# Patient Record
Sex: Male | Born: 1935 | Race: White | Hispanic: No | Marital: Single | Smoking: Former smoker
Health system: Southern US, Community
[De-identification: ages and names within clinical notes are randomized; demographics above are authoritative.]

## PROBLEM LIST (undated history)

## (undated) DIAGNOSIS — I10 Essential (primary) hypertension: Secondary | ICD-10-CM

## (undated) HISTORY — PX: BACK SURGERY: SHX140

## (undated) HISTORY — PX: PENILE PROSTHESIS IMPLANT: SHX240

---

## 2014-04-20 ENCOUNTER — Emergency Department (HOSPITAL_COMMUNITY): Payer: Medicare Other

## 2014-04-20 ENCOUNTER — Observation Stay (HOSPITAL_COMMUNITY)
Admission: EM | Admit: 2014-04-20 | Discharge: 2014-04-24 | Disposition: A | Discharge status: Home / Self Care | Payer: Medicare Other | Attending: Surgery | Admitting: Surgery

## 2014-04-20 ENCOUNTER — Encounter (HOSPITAL_COMMUNITY): Payer: Self-pay | Admitting: Emergency Medicine

## 2014-04-20 DIAGNOSIS — W19XXXA Unspecified fall, initial encounter: Secondary | ICD-10-CM

## 2014-04-20 DIAGNOSIS — S22009A Unspecified fracture of unspecified thoracic vertebra, initial encounter for closed fracture: Secondary | ICD-10-CM | POA: Diagnosis not present

## 2014-04-20 DIAGNOSIS — Y9389 Activity, other specified: Secondary | ICD-10-CM | POA: Diagnosis not present

## 2014-04-20 DIAGNOSIS — Z87891 Personal history of nicotine dependence: Secondary | ICD-10-CM | POA: Insufficient documentation

## 2014-04-20 DIAGNOSIS — Y998 Other external cause status: Secondary | ICD-10-CM | POA: Insufficient documentation

## 2014-04-20 DIAGNOSIS — R109 Unspecified abdominal pain: Secondary | ICD-10-CM | POA: Diagnosis not present

## 2014-04-20 DIAGNOSIS — S2232XA Fracture of one rib, left side, initial encounter for closed fracture: Secondary | ICD-10-CM

## 2014-04-20 DIAGNOSIS — IMO0002 Reserved for concepts with insufficient information to code with codable children: Secondary | ICD-10-CM | POA: Diagnosis present

## 2014-04-20 DIAGNOSIS — I10 Essential (primary) hypertension: Secondary | ICD-10-CM | POA: Diagnosis not present

## 2014-04-20 DIAGNOSIS — S270XXA Traumatic pneumothorax, initial encounter: Secondary | ICD-10-CM | POA: Insufficient documentation

## 2014-04-20 DIAGNOSIS — M25559 Pain in unspecified hip: Secondary | ICD-10-CM | POA: Diagnosis not present

## 2014-04-20 DIAGNOSIS — W1789XA Other fall from one level to another, initial encounter: Secondary | ICD-10-CM | POA: Insufficient documentation

## 2014-04-20 DIAGNOSIS — S2249XA Multiple fractures of ribs, unspecified side, initial encounter for closed fracture: Secondary | ICD-10-CM | POA: Diagnosis present

## 2014-04-20 DIAGNOSIS — S32009A Unspecified fracture of unspecified lumbar vertebra, initial encounter for closed fracture: Secondary | ICD-10-CM | POA: Diagnosis not present

## 2014-04-20 DIAGNOSIS — S2239XA Fracture of one rib, unspecified side, initial encounter for closed fracture: Secondary | ICD-10-CM | POA: Diagnosis present

## 2014-04-20 HISTORY — DX: Essential (primary) hypertension: I10

## 2014-04-20 LAB — I-STAT CHEM 8, ED
BUN: 19 mg/dL (ref 6–23)
CREATININE: 1.3 mg/dL (ref 0.50–1.35)
Calcium, Ion: 1.21 mmol/L (ref 1.13–1.30)
Chloride: 104 mEq/L (ref 96–112)
Glucose, Bld: 132 mg/dL — ABNORMAL HIGH (ref 70–99)
HCT: 42 % (ref 39.0–52.0)
HEMOGLOBIN: 14.3 g/dL (ref 13.0–17.0)
Potassium: 4.3 mEq/L (ref 3.7–5.3)
SODIUM: 142 meq/L (ref 137–147)
TCO2: 25 mmol/L (ref 0–100)

## 2014-04-20 MED ORDER — IOHEXOL 300 MG/ML  SOLN
80.0000 mL | Freq: Once | INTRAMUSCULAR | Status: AC | PRN
  Administered 2014-04-20: 80 mL via INTRAVENOUS

## 2014-04-20 MED ORDER — MORPHINE SULFATE 4 MG/ML IJ SOLN
4.0000 mg | Freq: Once | INTRAMUSCULAR | Status: AC
  Administered 2014-04-20: 4 mg via INTRAVENOUS
  Filled 2014-04-20: qty 1

## 2014-04-20 NOTE — ED Notes (Signed)
Pt was building a tree stand today and was 8 feet when he fell.  Pt landed on grass ground with left side of body.  Pt is moving all extremities.  No neck or back pain.  Pt is on LSB.  Pt has complained of left lumbar/flank area pain.  No LOC.  BP150/90  P-108.

## 2014-04-20 NOTE — ED Notes (Signed)
Pt returned from CT  Pt requesting something for pain at this time

## 2014-04-20 NOTE — ED Notes (Signed)
Pt to CT at this time.

## 2014-04-20 NOTE — H&P (Signed)
History   Blake [name] is an 78 y.o. male.   Chief Complaint:  Chief Complaint  Patient presents with  . Fall    Fall Associated symptoms include chest pain. Pertinent negatives include no abdominal pain, coughing, headaches, myalgias, nausea, neck pain or vomiting.  This is a 78 yo male that lives in [place], [place], who was in the area working on his deer stand, preparing it for deer season this fall.  He lost his balance and fell backwards about 8 feet, landing on the left side of his back.  He did not hit his head.  No LOC.  He is complaining of pain on the left side of his back from pelvis to shoulder.  No shortness of breath.  No anterior chest or abdominal pain.  No nausea or vomiting.   Past Medical History  Diagnosis Date  . Hypertension     Past Surgical History  Procedure Laterality Date  . Back surgery    . Penile prosthesis implant    Lumbar fusion  No family history on file. Social History:  reports that he has quit smoking. He does not have any smokeless tobacco history on file. He reports that he drinks alcohol. He reports that he does not use illicit drugs.  Allergies   Allergies  Allergen Reactions  . Penicillins Shortness Of Breath, Swelling and Rash  . Septra [Sulfamethoxazole-Trimethoprim] Other (See Comments)    Told not to take due to liver    Home Medications   Prior to Admission medications   Medication Sig Start Date End Date Taking? Authorizing Provider  losartan (COZAAR) 100 MG tablet Take 100 mg by mouth daily.   Yes Historical Provider, MD     Trauma Course   Results for orders placed during the hospital encounter of 04/20/14 (from the past 48 hour(s))  I-STAT CHEM 8, ED     Status: Abnormal   Collection Time    04/20/14  9:28 PM      Result Value Ref Range   Sodium 142  137 - 147 mEq/L   Potassium 4.3  3.7 - 5.3 mEq/L   Chloride 104  96 - 112 mEq/L   BUN 19  6 - 23 mg/dL   Creatinine, Ser 0.981.30  0.50 - 1.35 mg/dL   Glucose, Bld 119132  (*) 70 - 99 mg/dL   Calcium, Ion 1.471.21  8.291.13 - 1.30 mmol/L   TCO2 25  0 - 100 mmol/L   Hemoglobin 14.3  13.0 - 17.0 g/dL   HCT 56.242.0  13.039.0 - 86.552.0 %   Ct Chest W Contrast  04/20/2014   CLINICAL DATA:  Blake [name] 8 feet from a deer stand.  Left sided pain.  EXAM: CT CHEST, ABDOMEN, AND PELVIS WITH CONTRAST  TECHNIQUE: Multidetector CT imaging of the chest, abdomen and pelvis was performed following the standard protocol during bolus administration of intravenous contrast.  CONTRAST:  80mL OMNIPAQUE IOHEXOL 300 MG/ML  SOLN  COMPARISON:  None.  FINDINGS: CT CHEST FINDINGS  Soft tissue / Mediastinum: There is no axillary lymphadenopathy. No mediastinal or hilar lymphadenopathy. Heart size is normal. Coronary artery calcification is noted. No pericardial effusion. Soft tissue gas is seen along the posterior left chest wall, adjacent to the ribs.  2.0 cm round lesion in the skin and subcutaneous tissues of the midline back is probably a sebaceous cyst.  Lungs / Pleura: Fine detail of the lung parenchyma is obscured by respiratory motion. Are there is no focal airspace consolidation. Compressive atelectasis  is seen bilaterally. There are a few tiny gas loculations in the left posterior pleural space.  Bones: Fractures of the posterior left eighth through eleventh ribs are evident with minimal displacement at the level of the eighth rib. Heart the patient also was nondisplaced fractures of the left T8, 9, 10, 11, and 12 transverse processes.  CT ABDOMEN AND PELVIS FINDINGS  Liver:  Insert normal Liver  Spleen: No splenomegaly. No focal mass lesion.  Stomach: Nondistended. No gastric wall thickening. No evidence of outlet obstruction.  Pancreas: No focal mass lesion. No dilatation of the main duct. No intraparenchymal cyst. No peripancreatic edema.  Gallbladder/Biliary: No evidence for gallstones. No pericholecystic fluid. No intrahepatic or extrahepatic biliary dilation.  Kidneys/Adrenals: No adrenal nodule or mass. Small 6  mm low-density lesion in the left kidney is likely a cyst.  Bowel Loops: No evidence for small bowel obstruction. No small bowel wall thickening. Terminal ileum is normal. The appendix is normal. No colonic diverticulitis.  Nodes: No abdominal lymphadenopathy. No pelvic sidewall lymphadenopathy.  Vasculature: Atherosclerotic calcification is noted in the wall of the abdominal aorta without aneurysm.  Pelvic Genitourinary: Bladder is moderately distended. The prostate gland is enlarged. Penile prosthesis is evident.  Bones/Musculoskeletal: Patient is status post lower lumbar fusion. Left-sided transverse process fractures are seen at L1, L2, L3, L4, and L5.  Body Wall: No evidence for abdominal wall hernia.  Other: No intraperitoneal free fluid.  IMPRESSION: 1. Fractures of the posterior left eighth through eleventh ribs. There is soft tissue gas adjacent to the rib fractures in a trace amount of gas is seen deep to the ribs consistent with a trace pneumothorax although no gross pneumothorax is evident. No confluent gas is seen within the left pleural space. 2. The patient has numerous left-sided transverse process fractures extending from the T8 level down to the L5 level. 3. No evidence for solid organ injury in the abdomen. There is no perihepatic or perisplenic free fluid. No free fluid in the pelvis.   Electronically Signed   By: Kennith Center M.D.   On: 04/20/2014 23:09   Ct Abdomen Pelvis W Contrast  04/20/2014   CLINICAL DATA:  Blake [name] 8 feet from a deer stand.  Left sided pain.  EXAM: CT CHEST, ABDOMEN, AND PELVIS WITH CONTRAST  TECHNIQUE: Multidetector CT imaging of the chest, abdomen and pelvis was performed following the standard protocol during bolus administration of intravenous contrast.  CONTRAST:  80mL OMNIPAQUE IOHEXOL 300 MG/ML  SOLN  COMPARISON:  None.  FINDINGS: CT CHEST FINDINGS  Soft tissue / Mediastinum: There is no axillary lymphadenopathy. No mediastinal or hilar lymphadenopathy. Heart size  is normal. Coronary artery calcification is noted. No pericardial effusion. Soft tissue gas is seen along the posterior left chest wall, adjacent to the ribs.  2.0 cm round lesion in the skin and subcutaneous tissues of the midline back is probably a sebaceous cyst.  Lungs / Pleura: Fine detail of the lung parenchyma is obscured by respiratory motion. Are there is no focal airspace consolidation. Compressive atelectasis is seen bilaterally. There are a few tiny gas loculations in the left posterior pleural space.  Bones: Fractures of the posterior left eighth through eleventh ribs are evident with minimal displacement at the level of the eighth rib. Heart the patient also was nondisplaced fractures of the left T8, 9, 10, 11, and 12 transverse processes.  CT ABDOMEN AND PELVIS FINDINGS  Liver:  Insert normal Liver  Spleen: No splenomegaly. No focal mass lesion.  Stomach: Nondistended. No gastric wall thickening. No evidence of outlet obstruction.  Pancreas: No focal mass lesion. No dilatation of the main duct. No intraparenchymal cyst. No peripancreatic edema.  Gallbladder/Biliary: No evidence for gallstones. No pericholecystic fluid. No intrahepatic or extrahepatic biliary dilation.  Kidneys/Adrenals: No adrenal nodule or mass. Small 6 mm low-density lesion in the left kidney is likely a cyst.  Bowel Loops: No evidence for small bowel obstruction. No small bowel wall thickening. Terminal ileum is normal. The appendix is normal. No colonic diverticulitis.  Nodes: No abdominal lymphadenopathy. No pelvic sidewall lymphadenopathy.  Vasculature: Atherosclerotic calcification is noted in the wall of the abdominal aorta without aneurysm.  Pelvic Genitourinary: Bladder is moderately distended. The prostate gland is enlarged. Penile prosthesis is evident.  Bones/Musculoskeletal: Patient is status post lower lumbar fusion. Left-sided transverse process fractures are seen at L1, L2, L3, L4, and L5.  Body Wall: No evidence for  abdominal wall hernia.  Other: No intraperitoneal free fluid.  IMPRESSION: 1. Fractures of the posterior left eighth through eleventh ribs. There is soft tissue gas adjacent to the rib fractures in a trace amount of gas is seen deep to the ribs consistent with a trace pneumothorax although no gross pneumothorax is evident. No confluent gas is seen within the left pleural space. 2. The patient has numerous left-sided transverse process fractures extending from the T8 level down to the L5 level. 3. No evidence for solid organ injury in the abdomen. There is no perihepatic or perisplenic free fluid. No free fluid in the pelvis.   Electronically Signed   By: Kennith Center M.D.   On: 04/20/2014 23:09   Dg Pelvis Portable  04/20/2014   CLINICAL DATA:  Fall from deer stand.  Pain.  EXAM: PORTABLE PELVIS 1-2 VIEWS  COMPARISON:  None.  FINDINGS: Supine view of the pelvis shows no evidence for fracture. SI joints and symphysis pubis are unremarkable. Joint space in the hips is well preserved and symmetric. Penile prosthesis noted. The patient is status post lower lumbar fusion.  IMPRESSION: No evidence for acute pelvic fracture.   Electronically Signed   By: Kennith Center M.D.   On: 04/20/2014 19:18   Dg Chest Portable 1 View  04/20/2014   CLINICAL DATA:  Blake [name] 8 feet.  EXAM: PORTABLE CHEST - 1 VIEW  COMPARISON:  None.  FINDINGS: 1847 hrs. Low volume film without evidence for pneumothorax, focal airspace consolidation or pleural effusion. The cardio pericardial silhouette is enlarged. Haziness over the right apex is attributed to rotation superimposed soft tissue. Telemetry leads overlie the chest.  IMPRESSION: No acute cardiopulmonary findings.   Electronically Signed   By: Kennith Center M.D.   On: 04/20/2014 19:17    Review of Systems  Constitutional: Negative for weight loss.  HENT: Negative for ear discharge, ear pain, hearing loss and tinnitus.   Eyes: Negative for blurred vision, double vision, photophobia  and pain.  Respiratory: Negative for cough, sputum production and shortness of breath.   Cardiovascular: Positive for chest pain.  Gastrointestinal: Negative for nausea, vomiting and abdominal pain.  Genitourinary: Negative for dysuria, urgency, frequency and flank pain.  Musculoskeletal: Positive for back pain, falls and joint pain. Negative for myalgias and neck pain.  Neurological: Negative for dizziness, tingling, sensory change, focal weakness, loss of consciousness and headaches.  Endo/Heme/Allergies: Does not bruise/bleed easily.  Psychiatric/Behavioral: Negative for depression, memory loss and substance abuse. The patient is not nervous/anxious.     Blood pressure 136/73, pulse 115, temperature 97.8  F (36.6 C), temperature source Oral, resp. rate 21, SpO2 99.00%. Physical Exam  Constitutional: He is oriented to person, place, and time. He appears well-developed and well-nourished.  HENT:  Head: Normocephalic and atraumatic.  Eyes: EOM are normal. Pupils are equal, round, and reactive to light.  Neck: Normal range of motion. Neck supple.  Cardiovascular: Regular rhythm.   Mildly tachycardic  Respiratory: Effort normal and breath sounds normal.  CTA bilaterally Tender over posterior left chest and spine  GI: Soft. Bowel sounds are normal.  Musculoskeletal: Normal range of motion.  Neurological: He is alert and oriented to person, place, and time.  Skin: Skin is warm and dry.  Psychiatric: He has a normal mood and affect. His behavior is normal. Judgment and thought content normal.    Assessment/Plan 1.  Fall from 8 feet 2.  Posterior rib fractures left ribs 8-11 3.  Left transverse process fractures T8-12, L1-5 4.  Tiny occult pneumothorax left  Admit for observation, pain control Incentive spirometer Oxygen Ambulate with assistance  Ophelia Sipe K. 04/20/2014, 11:57 PM   Procedures

## 2014-04-20 NOTE — ED Provider Notes (Signed)
CSN: [id number]     Arrival date & time 04/20/14  1802 History   First MD Initiated Contact with Patient 04/20/14 1806     Chief Complaint  Patient presents with  . Fall     (Consider location/radiation/quality/duration/timing/severity/associated sxs/prior Treatment) Patient is a 78 y.o. male presenting with trauma.  Trauma Mechanism of injury: fall Injury location: torso Injury location detail: L chest and L flank Incident location: home Arrived directly from scene: yes   Fall:      Fall occurred: from a ladder      Height of fall: 7 ft      Impact surface: dirt and grass      Point of impact: back      Entrapped after fall: no  EMS/PTA data:      Bystander interventions: none      Ambulatory at scene: no      Loss of consciousness: no      Amnesic to event: no  Current symptoms:      Pain scale: 2/10      Pain timing: intermittent      Associated symptoms:            Reports back pain.            Denies abdominal pain, chest pain, headache, loss of consciousness, nausea, neck pain, seizures and vomiting.    Past Medical History  Diagnosis Date  . Hypertension    Past Surgical History  Procedure Laterality Date  . Back surgery    . Penile prosthesis implant     No family history on file. History  Substance Use Topics  . Smoking status: Former Games developermoker  . Smokeless tobacco: Not on file  . Alcohol Use: Yes     Comment: occasional    Review of Systems  Constitutional: Negative for fever and chills.  HENT: Negative for sore throat.   Eyes: Negative for pain.  Respiratory: Negative for cough and shortness of breath.   Cardiovascular: Negative for chest pain.  Gastrointestinal: Negative for nausea, vomiting and abdominal pain.  Genitourinary: Negative for dysuria and flank pain.  Musculoskeletal: Positive for back pain. Negative for neck pain.  Skin: Negative for rash.  Neurological: Negative for seizures, loss of consciousness and headaches.       Allergies  Penicillins and Septra  Home Medications   Prior to Admission medications   Medication Sig Start Date End Date Taking? Authorizing Provider  losartan (COZAAR) 100 MG tablet Take 100 mg by mouth daily.   Yes Historical Provider, MD   BP 136/73  Pulse 115  Temp(Src) 97.8 F (36.6 C) (Oral)  Resp 21  SpO2 99% Physical Exam  Constitutional: He is oriented to person, place, and time. He appears well-developed and well-nourished. No distress.  HENT:  Head: Normocephalic and atraumatic.  Eyes: Pupils are equal, round, and reactive to light.  Neck: Normal range of motion.  Cardiovascular: Normal rate and regular rhythm.   Pulmonary/Chest: Effort normal and breath sounds normal. No respiratory distress. He exhibits no bony tenderness.  Abdominal: Soft. He exhibits no distension. There is no tenderness.  Musculoskeletal: Normal range of motion.       Cervical back: He exhibits no bony tenderness and no deformity.       Thoracic back: He exhibits no bony tenderness and no deformity.       Lumbar back: He exhibits tenderness and bony tenderness. He exhibits no deformity.  Neurological: He is alert and oriented to person, place,  and time.  Skin: Skin is warm. He is not diaphoretic.    ED Course  Procedures (including critical care time) Labs Review Labs Reviewed  I-STAT CHEM 8, ED - Abnormal; Notable for the following:    Glucose, Bld 132 (*)    All other components within normal limits  CDS SEROLOGY  COMPREHENSIVE METABOLIC PANEL  CBC  ETHANOL  PROTIME-INR  SAMPLE TO BLOOD BANK    Imaging Review Ct Chest W Contrast  04/20/2014   CLINICAL DATA:  [name] 8 feet from a deer stand.  Left sided pain.  EXAM: CT CHEST, ABDOMEN, AND PELVIS WITH CONTRAST  TECHNIQUE: Multidetector CT imaging of the chest, abdomen and pelvis was performed following the standard protocol during bolus administration of intravenous contrast.  CONTRAST:  80mL OMNIPAQUE IOHEXOL 300 MG/ML  SOLN   COMPARISON:  None.  FINDINGS: CT CHEST FINDINGS  Soft tissue / Mediastinum: There is no axillary lymphadenopathy. No mediastinal or hilar lymphadenopathy. Heart size is normal. Coronary artery calcification is noted. No pericardial effusion. Soft tissue gas is seen along the posterior left chest wall, adjacent to the ribs.  2.0 cm round lesion in the skin and subcutaneous tissues of the midline back is probably a sebaceous cyst.  Lungs / Pleura: Fine detail of the lung parenchyma is obscured by respiratory motion. Are there is no focal airspace consolidation. Compressive atelectasis is seen bilaterally. There are a few tiny gas loculations in the left posterior pleural space.  Bones: Fractures of the posterior left eighth through eleventh ribs are evident with minimal displacement at the level of the eighth rib. Heart the patient also was nondisplaced fractures of the left T8, 9, 10, 11, and 12 transverse processes.  CT ABDOMEN AND PELVIS FINDINGS  Liver:  Insert normal Liver  Spleen: No splenomegaly. No focal mass lesion.  Stomach: Nondistended. No gastric wall thickening. No evidence of outlet obstruction.  Pancreas: No focal mass lesion. No dilatation of the main duct. No intraparenchymal cyst. No peripancreatic edema.  Gallbladder/Biliary: No evidence for gallstones. No pericholecystic fluid. No intrahepatic or extrahepatic biliary dilation.  Kidneys/Adrenals: No adrenal nodule or mass. Small 6 mm low-density lesion in the left kidney is likely a cyst.  Bowel Loops: No evidence for small bowel obstruction. No small bowel wall thickening. Terminal ileum is normal. The appendix is normal. No colonic diverticulitis.  Nodes: No abdominal lymphadenopathy. No pelvic sidewall lymphadenopathy.  Vasculature: Atherosclerotic calcification is noted in the wall of the abdominal aorta without aneurysm.  Pelvic Genitourinary: Bladder is moderately distended. The prostate gland is enlarged. Penile prosthesis is evident.   Bones/Musculoskeletal: Patient is status post lower lumbar fusion. Left-sided transverse process fractures are seen at L1, L2, L3, L4, and L5.  Body Wall: No evidence for abdominal wall hernia.  Other: No intraperitoneal free fluid.  IMPRESSION: 1. Fractures of the posterior left eighth through eleventh ribs. There is soft tissue gas adjacent to the rib fractures in a trace amount of gas is seen deep to the ribs consistent with a trace pneumothorax although no gross pneumothorax is evident. No confluent gas is seen within the left pleural space. 2. The patient has numerous left-sided transverse process fractures extending from the T8 level down to the L5 level. 3. No evidence for solid organ injury in the abdomen. There is no perihepatic or perisplenic free fluid. No free fluid in the pelvis.   Electronically Signed   By: Kennith Center M.D.   On: 04/20/2014 23:09   Ct Abdomen Pelvis  W Contrast  04/20/2014   CLINICAL DATA:  [name] 8 feet from a deer stand.  Left sided pain.  EXAM: CT CHEST, ABDOMEN, AND PELVIS WITH CONTRAST  TECHNIQUE: Multidetector CT imaging of the chest, abdomen and pelvis was performed following the standard protocol during bolus administration of intravenous contrast.  CONTRAST:  80mL OMNIPAQUE IOHEXOL 300 MG/ML  SOLN  COMPARISON:  None.  FINDINGS: CT CHEST FINDINGS  Soft tissue / Mediastinum: There is no axillary lymphadenopathy. No mediastinal or hilar lymphadenopathy. Heart size is normal. Coronary artery calcification is noted. No pericardial effusion. Soft tissue gas is seen along the posterior left chest wall, adjacent to the ribs.  2.0 cm round lesion in the skin and subcutaneous tissues of the midline back is probably a sebaceous cyst.  Lungs / Pleura: Fine detail of the lung parenchyma is obscured by respiratory motion. Are there is no focal airspace consolidation. Compressive atelectasis is seen bilaterally. There are a few tiny gas loculations in the left posterior pleural space.   Bones: Fractures of the posterior left eighth through eleventh ribs are evident with minimal displacement at the level of the eighth rib. Heart the patient also was nondisplaced fractures of the left T8, 9, 10, 11, and 12 transverse processes.  CT ABDOMEN AND PELVIS FINDINGS  Liver:  Insert normal Liver  Spleen: No splenomegaly. No focal mass lesion.  Stomach: Nondistended. No gastric wall thickening. No evidence of outlet obstruction.  Pancreas: No focal mass lesion. No dilatation of the main duct. No intraparenchymal cyst. No peripancreatic edema.  Gallbladder/Biliary: No evidence for gallstones. No pericholecystic fluid. No intrahepatic or extrahepatic biliary dilation.  Kidneys/Adrenals: No adrenal nodule or mass. Small 6 mm low-density lesion in the left kidney is likely a cyst.  Bowel Loops: No evidence for small bowel obstruction. No small bowel wall thickening. Terminal ileum is normal. The appendix is normal. No colonic diverticulitis.  Nodes: No abdominal lymphadenopathy. No pelvic sidewall lymphadenopathy.  Vasculature: Atherosclerotic calcification is noted in the wall of the abdominal aorta without aneurysm.  Pelvic Genitourinary: Bladder is moderately distended. The prostate gland is enlarged. Penile prosthesis is evident.  Bones/Musculoskeletal: Patient is status post lower lumbar fusion. Left-sided transverse process fractures are seen at L1, L2, L3, L4, and L5.  Body Wall: No evidence for abdominal wall hernia.  Other: No intraperitoneal free fluid.  IMPRESSION: 1. Fractures of the posterior left eighth through eleventh ribs. There is soft tissue gas adjacent to the rib fractures in a trace amount of gas is seen deep to the ribs consistent with a trace pneumothorax although no gross pneumothorax is evident. No confluent gas is seen within the left pleural space. 2. The patient has numerous left-sided transverse process fractures extending from the T8 level down to the L5 level. 3. No evidence for  solid organ injury in the abdomen. There is no perihepatic or perisplenic free fluid. No free fluid in the pelvis.   Electronically Signed   By: Kennith Center M.D.   On: 04/20/2014 23:09   Dg Pelvis Portable  04/20/2014   CLINICAL DATA:  Fall from deer stand.  Pain.  EXAM: PORTABLE PELVIS 1-2 VIEWS  COMPARISON:  None.  FINDINGS: Supine view of the pelvis shows no evidence for fracture. SI joints and symphysis pubis are unremarkable. Joint space in the hips is well preserved and symmetric. Penile prosthesis noted. The patient is status post lower lumbar fusion.  IMPRESSION: No evidence for acute pelvic fracture.   Electronically Signed   By:  Kennith Center M.D.   On: 04/20/2014 19:18   Dg Chest Portable 1 View  04/20/2014   CLINICAL DATA:  [name] 8 feet.  EXAM: PORTABLE CHEST - 1 VIEW  COMPARISON:  None.  FINDINGS: 1847 hrs. Low volume film without evidence for pneumothorax, focal airspace consolidation or pleural effusion. The cardio pericardial silhouette is enlarged. Haziness over the right apex is attributed to rotation superimposed soft tissue. Telemetry leads overlie the chest.  IMPRESSION: No acute cardiopulmonary findings.   Electronically Signed   By: Kennith Center M.D.   On: 04/20/2014 19:17     EKG Interpretation None      MDM   Final diagnoses:  Rib fractures, left, closed, initial encounter  Multiple transverse process fractures   78 year old male with a history of hypertension presents after falling from a ladder approximately 78 feet landing on his left side.  On arrival the patient is hemodynamically stable and appears in no acute distress. He is complaining of pain in his left side. He appears uncomfortable. Given the patient's age and given the fall the plan is to obtain x-rays of his chest and pelvis and then sent him for CT scan of his chest abdomen pelvis.  Plain films of injury no acute findings. No evidence of acute pelvic fracture. No acute cardiopulmonary findings. CT  scan demonstrates fractures of the posterior left eighth through 11th ribs as well as transverse process fractures from T8-L5. Consulted with trauma for admission for pain control. Anticipate admission in stable condition. Patient seen and evaluated by myself and by the attending Dr. Judd Lien.      Imagene Sheller, MD 04/20/14 [phone]

## 2014-04-21 ENCOUNTER — Observation Stay (HOSPITAL_COMMUNITY): Payer: Medicare Other

## 2014-04-21 DIAGNOSIS — S2249XA Multiple fractures of ribs, unspecified side, initial encounter for closed fracture: Secondary | ICD-10-CM

## 2014-04-21 DIAGNOSIS — S22009A Unspecified fracture of unspecified thoracic vertebra, initial encounter for closed fracture: Secondary | ICD-10-CM

## 2014-04-21 DIAGNOSIS — S8290XS Unspecified fracture of unspecified lower leg, sequela: Secondary | ICD-10-CM

## 2014-04-21 LAB — COMPREHENSIVE METABOLIC PANEL
ALT: 32 U/L (ref 0–53)
AST: 47 U/L — ABNORMAL HIGH (ref 0–37)
Albumin: 3.5 g/dL (ref 3.5–5.2)
Alkaline Phosphatase: 66 U/L (ref 39–117)
Anion gap: 12 (ref 5–15)
BUN: 19 mg/dL (ref 6–23)
CO2: 25 mEq/L (ref 19–32)
Calcium: 9.2 mg/dL (ref 8.4–10.5)
Chloride: 105 mEq/L (ref 96–112)
Creatinine, Ser: 1.13 mg/dL (ref 0.50–1.35)
GFR calc non Af Amer: 61 mL/min — ABNORMAL LOW (ref >90)
GFR, EST AFRICAN AMERICAN: 71 mL/min — AB (ref >90)
GLUCOSE: 145 mg/dL — AB (ref 70–99)
Potassium: 4.8 mEq/L (ref 3.7–5.3)
Sodium: 142 mEq/L (ref 137–147)
Total Bilirubin: 0.5 mg/dL (ref 0.3–1.2)
Total Protein: 6.7 g/dL (ref 6.0–8.3)

## 2014-04-21 LAB — CBC
HCT: 40.4 % (ref 39.0–52.0)
HEMATOCRIT: 37.4 % — AB (ref 39.0–52.0)
HEMOGLOBIN: 12 g/dL — AB (ref 13.0–17.0)
Hemoglobin: 13.3 g/dL (ref 13.0–17.0)
MCH: 30.5 pg (ref 26.0–34.0)
MCH: 31.6 pg (ref 26.0–34.0)
MCHC: 32.1 g/dL (ref 30.0–36.0)
MCHC: 32.9 g/dL (ref 30.0–36.0)
MCV: 95.2 fL (ref 78.0–100.0)
MCV: 96 fL (ref 78.0–100.0)
Platelets: 127 10*3/uL — ABNORMAL LOW (ref 150–400)
Platelets: 159 10*3/uL (ref 150–400)
RBC: 3.93 MIL/uL — ABNORMAL LOW (ref 4.22–5.81)
RBC: 4.21 MIL/uL — ABNORMAL LOW (ref 4.22–5.81)
RDW: 13.1 % (ref 11.5–15.5)
RDW: 13.2 % (ref 11.5–15.5)
WBC: 11.2 10*3/uL — AB (ref 4.0–10.5)
WBC: 12.7 10*3/uL — ABNORMAL HIGH (ref 4.0–10.5)

## 2014-04-21 LAB — CREATININE, SERUM
CREATININE: 1.09 mg/dL (ref 0.50–1.35)
GFR, EST AFRICAN AMERICAN: 74 mL/min — AB (ref >90)
GFR, EST NON AFRICAN AMERICAN: 64 mL/min — AB (ref >90)

## 2014-04-21 LAB — ETHANOL: Alcohol, Ethyl (B): <11 mg/dL (ref 0–11)

## 2014-04-21 LAB — CDS SEROLOGY

## 2014-04-21 LAB — SAMPLE TO BLOOD BANK

## 2014-04-21 LAB — PROTIME-INR
INR: 1.08 (ref 0.00–1.49)
PROTHROMBIN TIME: 14 s (ref 11.6–15.2)

## 2014-04-21 MED ORDER — ENOXAPARIN SODIUM 40 MG/0.4ML ~~LOC~~ SOLN
40.0000 mg | Freq: Every day | SUBCUTANEOUS | Status: DC
  Administered 2014-04-21 – 2014-04-24 (×4): 40 mg via SUBCUTANEOUS
  Filled 2014-04-21 (×4): qty 0.4

## 2014-04-21 MED ORDER — POLYETHYLENE GLYCOL 3350 17 G PO PACK
17.0000 g | PACK | Freq: Two times a day (BID) | ORAL | Status: DC
  Administered 2014-04-21 – 2014-04-24 (×7): 17 g via ORAL
  Filled 2014-04-21 (×9): qty 1

## 2014-04-21 MED ORDER — OXYCODONE HCL 5 MG PO TABS
5.0000 mg | ORAL_TABLET | ORAL | Status: DC | PRN
  Administered 2014-04-21 – 2014-04-22 (×3): 5 mg via ORAL
  Filled 2014-04-21 (×3): qty 1

## 2014-04-21 MED ORDER — PANTOPRAZOLE SODIUM 40 MG PO TBEC
40.0000 mg | DELAYED_RELEASE_TABLET | Freq: Every day | ORAL | Status: DC
  Administered 2014-04-21 – 2014-04-24 (×4): 40 mg via ORAL
  Filled 2014-04-21 (×4): qty 1

## 2014-04-21 MED ORDER — ONDANSETRON HCL 4 MG PO TABS: 4.0000 mg | ORAL_TABLET | Freq: Four times a day (QID) | ORAL | Status: DC | PRN

## 2014-04-21 MED ORDER — PANTOPRAZOLE SODIUM 40 MG IV SOLR
40.0000 mg | Freq: Every day | INTRAVENOUS | Status: DC
  Filled 2014-04-21 (×2): qty 40

## 2014-04-21 MED ORDER — ONDANSETRON HCL 4 MG/2ML IJ SOLN: 4.0000 mg | Freq: Four times a day (QID) | INTRAMUSCULAR | Status: DC | PRN

## 2014-04-21 MED ORDER — HYDROMORPHONE HCL PF 1 MG/ML IJ SOLN
1.0000 mg | INTRAMUSCULAR | Status: DC | PRN
Start: 2014-04-21 — End: 2014-04-24
  Administered 2014-04-21 – 2014-04-22 (×7): 1 mg via INTRAVENOUS
  Filled 2014-04-21 (×7): qty 1

## 2014-04-21 MED ORDER — LOSARTAN POTASSIUM 50 MG PO TABS
100.0000 mg | ORAL_TABLET | Freq: Every day | ORAL | Status: DC
  Administered 2014-04-21 – 2014-04-24 (×3): 100 mg via ORAL
  Filled 2014-04-21 (×4): qty 2

## 2014-04-21 NOTE — ED Provider Notes (Signed)
I saw and evaluated the patient, reviewed the resident's note and I agree with the findings and plan. Patient is a 78 year old male with history of hypertension. He presents by EMS after a fall. He was apparently installing a tree stand when he lost his balance and fell. He reports falling approximately 8 feet and landing on his left side. He is complaining of pain in his left chest and left abdomen. He denies any head or neck injury denies any headache or neck pain. He denies any back pain. He denies any numbness or tingling in his extremities.  On exam, vitals are stable and the patient is afebrile. Head is atraumatic, normocephalic. Neck is supple. There is no cervical spine tenderness and he has painless range of motion in all directions. Heart is regular rate and rhythm. Lungs are clear and equal bilaterally. Abdomen is soft, but tender to palpation in the left upper quadrant and left flank. There is no rebound and no guarding. There is tenderness to palpation of the left lateral chest wall. Extremities appear well perfused and are neurologically intact.  Plain films were obtained in the exam room revealing no pneumothorax and no evidence for pelvic fracture. CT scans of the chest, abdomen, and pelvis were and reveal several left sided rib fractures with small hemothorax and subcutaneous air. Surgery has been consulted and the patient will be admitted to the trauma service.   EKG Interpretation None        Geoffery Lyonsouglas Jedrek Dinovo, MD 04/21/14 [phone]

## 2014-04-21 NOTE — ED Notes (Signed)
Transporting patient to new room assignment. 

## 2014-04-21 NOTE — Progress Notes (Signed)
Subjective: Complains of pain left paraspinal area posteriorly. Otherwise stable. Denies headache or visual change. Denies nausea. Denies abdominal pain. Denies air hunger but cannot take a deep breath. Using dilaudid  SpO2 100% on 2 L. Pain score varies from 4-7. 97.4. Heart rate 97. BP 125/67.  Objective: Vital signs in last 24 hours: Temp:  [97.4 F (36.3 C)-98 F (36.7 C)] 97.4 F (36.3 C) (07/25 0515) Pulse Rate:  [97-115] 97 (07/25 0515) Resp:  [12-29] 18 (07/25 0515) BP: (123-161)/(67-139) 125/67 mmHg (07/25 0515) SpO2:  [92 %-100 %] 100 % (07/25 0515) Weight:  [247 lb 8 oz (112.265 kg)] 247 lb 8 oz (112.265 kg) (07/25 0106) Last BM Date: 04/20/14  Intake/Output from previous day: 07/24 0701 - 07/25 0700 In: 480 [P.O.:480] Out: -  Intake/Output this shift:    General appearance: sitting up in bed. Mild to moderate distress from back pain. Color good. Mental status normal. Neck: no adenopathy,, no JVD, supple, good active and passive range of motion without pain or posterior tenderness Resp: decreased breath sounds at bases. Poor respiratory effort. No wheeze or rhonchi. Cardio: regular rate and rhythm, S1, S2 normal, no murmur, click, rub or gallop GI: soft, non-tender; bowel sounds normal; no masses,  no organomegaly  Lab Results:   Recent Labs  04/21/14 04/21/14 0347  WBC 12.7* 11.2*  HGB 13.3 12.0*  HCT 40.4 37.4*  PLT 159 127*   BMET  Recent Labs  04/20/14 2128 04/21/14 04/21/14 0347  NA 142 142  --   K 4.3 4.8  --   CL 104 105  --   CO2  --  25  --   GLUCOSE 132* 145*  --   BUN 19 19  --   CREATININE 1.30 1.13 1.09  CALCIUM  --  9.2  --    PT/INR  Recent Labs  04/21/14  LABPROT 14.0  INR 1.08   ABG No results found for this basename: PHART, PCO2, PO2, HCO3,  in the last 72 hours  Studies/Results: Ct Chest W Contrast  04/20/2014   CLINICAL DATA:  Larey SeatFell 8 feet from a deer stand.  Left sided pain.  EXAM: CT CHEST, ABDOMEN, AND PELVIS  WITH CONTRAST  TECHNIQUE: Multidetector CT imaging of the chest, abdomen and pelvis was performed following the standard protocol during bolus administration of intravenous contrast.  CONTRAST:  80mL OMNIPAQUE IOHEXOL 300 MG/ML  SOLN  COMPARISON:  None.  FINDINGS: CT CHEST FINDINGS  Soft tissue / Mediastinum: There is no axillary lymphadenopathy. No mediastinal or hilar lymphadenopathy. Heart size is normal. Coronary artery calcification is noted. No pericardial effusion. Soft tissue gas is seen along the posterior left chest wall, adjacent to the ribs.  2.0 cm round lesion in the skin and subcutaneous tissues of the midline back is probably a sebaceous cyst.  Lungs / Pleura: Fine detail of the lung parenchyma is obscured by respiratory motion. Are there is no focal airspace consolidation. Compressive atelectasis is seen bilaterally. There are a few tiny gas loculations in the left posterior pleural space.  Bones: Fractures of the posterior left eighth through eleventh ribs are evident with minimal displacement at the level of the eighth rib. Heart the patient also was nondisplaced fractures of the left T8, 9, 10, 11, and 12 transverse processes.  CT ABDOMEN AND PELVIS FINDINGS  Liver:  Insert normal Liver  Spleen: No splenomegaly. No focal mass lesion.  Stomach: Nondistended. No gastric wall thickening. No evidence of outlet obstruction.  Pancreas: No focal  mass lesion. No dilatation of the main duct. No intraparenchymal cyst. No peripancreatic edema.  Gallbladder/Biliary: No evidence for gallstones. No pericholecystic fluid. No intrahepatic or extrahepatic biliary dilation.  Kidneys/Adrenals: No adrenal nodule or mass. Small 6 mm low-density lesion in the left kidney is likely a cyst.  Bowel Loops: No evidence for small bowel obstruction. No small bowel wall thickening. Terminal ileum is normal. The appendix is normal. No colonic diverticulitis.  Nodes: No abdominal lymphadenopathy. No pelvic sidewall  lymphadenopathy.  Vasculature: Atherosclerotic calcification is noted in the wall of the abdominal aorta without aneurysm.  Pelvic Genitourinary: Bladder is moderately distended. The prostate gland is enlarged. Penile prosthesis is evident.  Bones/Musculoskeletal: Patient is status post lower lumbar fusion. Left-sided transverse process fractures are seen at L1, L2, L3, L4, and L5.  Body Wall: No evidence for abdominal wall hernia.  Other: No intraperitoneal free fluid.  IMPRESSION: 1. Fractures of the posterior left eighth through eleventh ribs. There is soft tissue gas adjacent to the rib fractures in a trace amount of gas is seen deep to the ribs consistent with a trace pneumothorax although no gross pneumothorax is evident. No confluent gas is seen within the left pleural space. 2. The patient has numerous left-sided transverse process fractures extending from the T8 level down to the L5 level. 3. No evidence for solid organ injury in the abdomen. There is no perihepatic or perisplenic free fluid. No free fluid in the pelvis.   Electronically Signed   By: Kennith Center M.D.   On: 04/20/2014 23:09   Ct Abdomen Pelvis W Contrast  04/20/2014   CLINICAL DATA:  [name] 8 feet from a deer stand.  Left sided pain.  EXAM: CT CHEST, ABDOMEN, AND PELVIS WITH CONTRAST  TECHNIQUE: Multidetector CT imaging of the chest, abdomen and pelvis was performed following the standard protocol during bolus administration of intravenous contrast.  CONTRAST:  80mL OMNIPAQUE IOHEXOL 300 MG/ML  SOLN  COMPARISON:  None.  FINDINGS: CT CHEST FINDINGS  Soft tissue / Mediastinum: There is no axillary lymphadenopathy. No mediastinal or hilar lymphadenopathy. Heart size is normal. Coronary artery calcification is noted. No pericardial effusion. Soft tissue gas is seen along the posterior left chest wall, adjacent to the ribs.  2.0 cm round lesion in the skin and subcutaneous tissues of the midline back is probably a sebaceous cyst.  Lungs /  Pleura: Fine detail of the lung parenchyma is obscured by respiratory motion. Are there is no focal airspace consolidation. Compressive atelectasis is seen bilaterally. There are a few tiny gas loculations in the left posterior pleural space.  Bones: Fractures of the posterior left eighth through eleventh ribs are evident with minimal displacement at the level of the eighth rib. Heart the patient also was nondisplaced fractures of the left T8, 9, 10, 11, and 12 transverse processes.  CT ABDOMEN AND PELVIS FINDINGS  Liver:  Insert normal Liver  Spleen: No splenomegaly. No focal mass lesion.  Stomach: Nondistended. No gastric wall thickening. No evidence of outlet obstruction.  Pancreas: No focal mass lesion. No dilatation of the main duct. No intraparenchymal cyst. No peripancreatic edema.  Gallbladder/Biliary: No evidence for gallstones. No pericholecystic fluid. No intrahepatic or extrahepatic biliary dilation.  Kidneys/Adrenals: No adrenal nodule or mass. Small 6 mm low-density lesion in the left kidney is likely a cyst.  Bowel Loops: No evidence for small bowel obstruction. No small bowel wall thickening. Terminal ileum is normal. The appendix is normal. No colonic diverticulitis.  Nodes: No abdominal  lymphadenopathy. No pelvic sidewall lymphadenopathy.  Vasculature: Atherosclerotic calcification is noted in the wall of the abdominal aorta without aneurysm.  Pelvic Genitourinary: Bladder is moderately distended. The prostate gland is enlarged. Penile prosthesis is evident.  Bones/Musculoskeletal: Patient is status post lower lumbar fusion. Left-sided transverse process fractures are seen at L1, L2, L3, L4, and L5.  Body Wall: No evidence for abdominal wall hernia.  Other: No intraperitoneal free fluid.  IMPRESSION: 1. Fractures of the posterior left eighth through eleventh ribs. There is soft tissue gas adjacent to the rib fractures in a trace amount of gas is seen deep to the ribs consistent with a trace  pneumothorax although no gross pneumothorax is evident. No confluent gas is seen within the left pleural space. 2. The patient has numerous left-sided transverse process fractures extending from the T8 level down to the L5 level. 3. No evidence for solid organ injury in the abdomen. There is no perihepatic or perisplenic free fluid. No free fluid in the pelvis.   Electronically Signed   By: Kennith Center M.D.   On: 04/20/2014 23:09   Dg Pelvis Portable  04/20/2014   CLINICAL DATA:  Fall from deer stand.  Pain.  EXAM: PORTABLE PELVIS 1-2 VIEWS  COMPARISON:  None.  FINDINGS: Supine view of the pelvis shows no evidence for fracture. SI joints and symphysis pubis are unremarkable. Joint space in the hips is well preserved and symmetric. Penile prosthesis noted. The patient is status post lower lumbar fusion.  IMPRESSION: No evidence for acute pelvic fracture.   Electronically Signed   By: Kennith Center M.D.   On: 04/20/2014 19:18   Dg Chest Portable 1 View  04/20/2014   CLINICAL DATA:  [name] 8 feet.  EXAM: PORTABLE CHEST - 1 VIEW  COMPARISON:  None.  FINDINGS: 1847 hrs. Low volume film without evidence for pneumothorax, focal airspace consolidation or pleural effusion. The cardio pericardial silhouette is enlarged. Haziness over the right apex is attributed to rotation superimposed soft tissue. Telemetry leads overlie the chest.  IMPRESSION: No acute cardiopulmonary findings.   Electronically Signed   By: Kennith Center M.D.   On: 04/20/2014 19:17    Anti-infectives: Anti-infectives   None      Assessment/Plan:  1. Fall from 8 feet  2. Posterior rib fractures left ribs 8-11  3. Left transverse process fractures T8-12, L1-5  4. Tiny occult pneumothorax left  Chest x-ray, to view this morning. Getting ready to go down now O2 Incentive spirometry-hasn't started yet Regular diet Twice a day MiraLAX Ambulate    LOS: 1 day    Margaret Staggs M 04/21/2014

## 2014-04-22 DIAGNOSIS — S22009A Unspecified fracture of unspecified thoracic vertebra, initial encounter for closed fracture: Secondary | ICD-10-CM | POA: Diagnosis not present

## 2014-04-22 LAB — CBC
HCT: 36.2 % — ABNORMAL LOW (ref 39.0–52.0)
HEMOGLOBIN: 11.6 g/dL — AB (ref 13.0–17.0)
MCH: 31.6 pg (ref 26.0–34.0)
MCHC: 32 g/dL (ref 30.0–36.0)
MCV: 98.6 fL (ref 78.0–100.0)
PLATELETS: ADEQUATE 10*3/uL (ref 150–400)
RBC: 3.67 MIL/uL — AB (ref 4.22–5.81)
RDW: 13.4 % (ref 11.5–15.5)
WBC: 8.6 10*3/uL (ref 4.0–10.5)

## 2014-04-22 MED ORDER — OXYCODONE HCL 5 MG PO TABS
10.0000 mg | ORAL_TABLET | ORAL | Status: DC | PRN
  Administered 2014-04-22 – 2014-04-23 (×3): 10 mg via ORAL
  Filled 2014-04-22 (×4): qty 2

## 2014-04-22 MED ORDER — KETOROLAC TROMETHAMINE 10 MG PO TABS
10.0000 mg | ORAL_TABLET | Freq: Four times a day (QID) | ORAL | Status: DC
  Administered 2014-04-22 – 2014-04-23 (×4): 10 mg via ORAL
  Filled 2014-04-22 (×8): qty 1

## 2014-04-22 NOTE — Progress Notes (Signed)
Subjective: Ambulating in halls with walker. Tolerating diet. Notes swelling and pain in the left gluteal area. He thinks he landed on his gluteal area when he fell. Says he is having too much back pain to go home to [place].   IV Dilaudid helps. Oral narcotics not adequate.  Chest x-ray yesterday looked pretty good. No effusion. No pneumothorax. Small amount of atelectasis left base. Not unexpected. SpO2 97% on 1 L nasal cannula.  Objective: Vital signs in last 24 hours: Temp:  [97.2 F (36.2 C)-98.3 F (36.8 C)] 98.3 F (36.8 C) (07/26 1000) Pulse Rate:  [91-105] 99 (07/26 1000) Resp:  [16-20] 18 (07/26 1000) BP: (105-134)/(54-86) 106/54 mmHg (07/26 1000) SpO2:  [97 %-100 %] 97 % (07/26 1000) Last BM Date: 04/21/14  Intake/Output from previous day: 07/25 0701 - 07/26 0700 In: -  Out: 450 [Urine:450] Intake/Output this shift: Total I/O In: -  Out: 400 [Urine:400]  General appearance: pleasant and alert. I observed him ambulating the halls.  Mild to moderate discomfort from back pain. Mental status normal. Resp: lungs are basically clear anteriorly and posteriorly, although diminished vital capacity and  quieter breath sounds at bases. No rhonchi or wheeze. GI: soft, non-tender; bowel sounds normal; no masses,  no organomegaly Extremities: soft swelling in left  gluteal area, upper outer quadrant. No bruising. Consistent with deep hematoma.This does not appear to be under any tension.  Lab Results:   Recent Labs  04/21/14 0347 04/22/14 0514  WBC 11.2* 8.6  HGB 12.0* 11.6*  HCT 37.4* 36.2*  PLT 127* PLATELET CLUMPS NOTED ON SMEAR, COUNT APPEARS ADEQUATE   BMET  Recent Labs  04/20/14 2128 04/21/14 04/21/14 0347  NA 142 142  --   K 4.3 4.8  --   CL 104 105  --   CO2  --  25  --   GLUCOSE 132* 145*  --   BUN 19 19  --   CREATININE 1.30 1.13 1.09  CALCIUM  --  9.2  --    PT/INR  Recent Labs  04/21/14  LABPROT 14.0  INR 1.08   ABG No results found  for this basename: PHART, PCO2, PO2, HCO3,  in the last 72 hours  Studies/Results: Dg Chest 2 View  04/21/2014   CLINICAL DATA:  Evaluate for possible 0 cold left pneumothorax.  EXAM: CHEST  2 VIEW  COMPARISON:  Chest x-ray 04/20/2014.  FINDINGS: Lung volumes are low. No consolidative airspace disease. Ill-defined opacity at the base of the left hemithorax presumably reflects some subsegmental atelectasis. Trace left pleural effusion. No appreciable pneumothorax. Nondisplaced fracture of the posterior aspect of the left eighth rib is again noted. Other known nondisplaced fractures are not well demonstrated on today's plain film examination (see CT examination 04/20/2014 for full details). Heart size is is within normal limits. Upper mediastinal contours are unremarkable.  IMPRESSION: 1. No appreciable pneumothorax identified. 2. Low lung volumes with nondisplaced fracture of the posterior aspect of the left eighth rib with associated passive subsegmental atelectasis in the left lower lobe and trace left pleural effusion.   Electronically Signed   By: Trudie Reed M.D.   On: 04/21/2014 10:37   Ct Chest W Contrast  04/20/2014   CLINICAL DATA:  [name] 8 feet from a deer stand.  Left sided pain.  EXAM: CT CHEST, ABDOMEN, AND PELVIS WITH CONTRAST  TECHNIQUE: Multidetector CT imaging of the chest, abdomen and pelvis was performed following the standard protocol during bolus administration of intravenous contrast.  CONTRAST:  80mL OMNIPAQUE IOHEXOL 300 MG/ML  SOLN  COMPARISON:  None.  FINDINGS: CT CHEST FINDINGS  Soft tissue / Mediastinum: There is no axillary lymphadenopathy. No mediastinal or hilar lymphadenopathy. Heart size is normal. Coronary artery calcification is noted. No pericardial effusion. Soft tissue gas is seen along the posterior left chest wall, adjacent to the ribs.  2.0 cm round lesion in the skin and subcutaneous tissues of the midline back is probably a sebaceous cyst.  Lungs / Pleura: Fine  detail of the lung parenchyma is obscured by respiratory motion. Are there is no focal airspace consolidation. Compressive atelectasis is seen bilaterally. There are a few tiny gas loculations in the left posterior pleural space.  Bones: Fractures of the posterior left eighth through eleventh ribs are evident with minimal displacement at the level of the eighth rib. Heart the patient also was nondisplaced fractures of the left T8, 9, 10, 11, and 12 transverse processes.  CT ABDOMEN AND PELVIS FINDINGS  Liver:  Insert normal Liver  Spleen: No splenomegaly. No focal mass lesion.  Stomach: Nondistended. No gastric wall thickening. No evidence of outlet obstruction.  Pancreas: No focal mass lesion. No dilatation of the main duct. No intraparenchymal cyst. No peripancreatic edema.  Gallbladder/Biliary: No evidence for gallstones. No pericholecystic fluid. No intrahepatic or extrahepatic biliary dilation.  Kidneys/Adrenals: No adrenal nodule or mass. Small 6 mm low-density lesion in the left kidney is likely a cyst.  Bowel Loops: No evidence for small bowel obstruction. No small bowel wall thickening. Terminal ileum is normal. The appendix is normal. No colonic diverticulitis.  Nodes: No abdominal lymphadenopathy. No pelvic sidewall lymphadenopathy.  Vasculature: Atherosclerotic calcification is noted in the wall of the abdominal aorta without aneurysm.  Pelvic Genitourinary: Bladder is moderately distended. The prostate gland is enlarged. Penile prosthesis is evident.  Bones/Musculoskeletal: Patient is status post lower lumbar fusion. Left-sided transverse process fractures are seen at L1, L2, L3, L4, and L5.  Body Wall: No evidence for abdominal wall hernia.  Other: No intraperitoneal free fluid.  IMPRESSION: 1. Fractures of the posterior left eighth through eleventh ribs. There is soft tissue gas adjacent to the rib fractures in a trace amount of gas is seen deep to the ribs consistent with a trace pneumothorax  although no gross pneumothorax is evident. No confluent gas is seen within the left pleural space. 2. The patient has numerous left-sided transverse process fractures extending from the T8 level down to the L5 level. 3. No evidence for solid organ injury in the abdomen. There is no perihepatic or perisplenic free fluid. No free fluid in the pelvis.   Electronically Signed   By: Kennith Center M.D.   On: 04/20/2014 23:09   Ct Abdomen Pelvis W Contrast  04/20/2014   CLINICAL DATA:  [name] 8 feet from a deer stand.  Left sided pain.  EXAM: CT CHEST, ABDOMEN, AND PELVIS WITH CONTRAST  TECHNIQUE: Multidetector CT imaging of the chest, abdomen and pelvis was performed following the standard protocol during bolus administration of intravenous contrast.  CONTRAST:  80mL OMNIPAQUE IOHEXOL 300 MG/ML  SOLN  COMPARISON:  None.  FINDINGS: CT CHEST FINDINGS  Soft tissue / Mediastinum: There is no axillary lymphadenopathy. No mediastinal or hilar lymphadenopathy. Heart size is normal. Coronary artery calcification is noted. No pericardial effusion. Soft tissue gas is seen along the posterior left chest wall, adjacent to the ribs.  2.0 cm round lesion in the skin and subcutaneous tissues of the midline back is probably a sebaceous cyst.  Lungs / Pleura: Fine detail of the lung parenchyma is obscured by respiratory motion. Are there is no focal airspace consolidation. Compressive atelectasis is seen bilaterally. There are a few tiny gas loculations in the left posterior pleural space.  Bones: Fractures of the posterior left eighth through eleventh ribs are evident with minimal displacement at the level of the eighth rib. Heart the patient also was nondisplaced fractures of the left T8, 9, 10, 11, and 12 transverse processes.  CT ABDOMEN AND PELVIS FINDINGS  Liver:  Insert normal Liver  Spleen: No splenomegaly. No focal mass lesion.  Stomach: Nondistended. No gastric wall thickening. No evidence of outlet obstruction.  Pancreas: No  focal mass lesion. No dilatation of the main duct. No intraparenchymal cyst. No peripancreatic edema.  Gallbladder/Biliary: No evidence for gallstones. No pericholecystic fluid. No intrahepatic or extrahepatic biliary dilation.  Kidneys/Adrenals: No adrenal nodule or mass. Small 6 mm low-density lesion in the left kidney is likely a cyst.  Bowel Loops: No evidence for small bowel obstruction. No small bowel wall thickening. Terminal ileum is normal. The appendix is normal. No colonic diverticulitis.  Nodes: No abdominal lymphadenopathy. No pelvic sidewall lymphadenopathy.  Vasculature: Atherosclerotic calcification is noted in the wall of the abdominal aorta without aneurysm.  Pelvic Genitourinary: Bladder is moderately distended. The prostate gland is enlarged. Penile prosthesis is evident.  Bones/Musculoskeletal: Patient is status post lower lumbar fusion. Left-sided transverse process fractures are seen at L1, L2, L3, L4, and L5.  Body Wall: No evidence for abdominal wall hernia.  Other: No intraperitoneal free fluid.  IMPRESSION: 1. Fractures of the posterior left eighth through eleventh ribs. There is soft tissue gas adjacent to the rib fractures in a trace amount of gas is seen deep to the ribs consistent with a trace pneumothorax although no gross pneumothorax is evident. No confluent gas is seen within the left pleural space. 2. The patient has numerous left-sided transverse process fractures extending from the T8 level down to the L5 level. 3. No evidence for solid organ injury in the abdomen. There is no perihepatic or perisplenic free fluid. No free fluid in the pelvis.   Electronically Signed   By: Kennith Center M.D.   On: 04/20/2014 23:09   Dg Pelvis Portable  04/20/2014   CLINICAL DATA:  Fall from deer stand.  Pain.  EXAM: PORTABLE PELVIS 1-2 VIEWS  COMPARISON:  None.  FINDINGS: Supine view of the pelvis shows no evidence for fracture. SI joints and symphysis pubis are unremarkable. Joint space in  the hips is well preserved and symmetric. Penile prosthesis noted. The patient is status post lower lumbar fusion.  IMPRESSION: No evidence for acute pelvic fracture.   Electronically Signed   By: Kennith Center M.D.   On: 04/20/2014 19:18   Dg Chest Portable 1 View  04/20/2014   CLINICAL DATA:  [name] 8 feet.  EXAM: PORTABLE CHEST - 1 VIEW  COMPARISON:  None.  FINDINGS: 1847 hrs. Low volume film without evidence for pneumothorax, focal airspace consolidation or pleural effusion. The cardio pericardial silhouette is enlarged. Haziness over the right apex is attributed to rotation superimposed soft tissue. Telemetry leads overlie the chest.  IMPRESSION: No acute cardiopulmonary findings.   Electronically Signed   By: Kennith Center M.D.   On: 04/20/2014 19:17    Anti-infectives: Anti-infectives   None      Assessment/Plan:  1. Fall from 8 feet  2. Posterior rib fractures left ribs 8-11  3. Left transverse process fractures T8-12,  L1-5  4. Tiny occult pneumothorax left Not present on followup chest x-ray 5. Suspect stable left gluteal hematoma. Soft. Not expanding.   O2  Incentive spirometry- PT and OT consult Increase oxycodone from 5 to -10 mg Oral Toradol every 6 hours to see if this helps Regular diet  Twice a day MiraLAX  Ambulate Possible discharge tomorrow after PT and OT evals..     LOS: 2 days    Claud KelpINGRAM,Debie Ashline M 04/22/2014

## 2014-04-22 NOTE — Evaluation (Signed)
Occupational Therapy Evaluation Patient Details Name: Blake [name] MRN: [id number] DOB: 1936-07-17 Today's Date: 04/22/2014    History of Present Illness 78 y.o. who suffered fall from 8 feet resulting in left rib fractures, Left transverse process fractures T8-12, L1-5, and Tiny occult pneumothorax left.   Clinical Impression   Pt s/p above. Pt limited by pain. Feel pt will benefit from acute OT to increase independence prior to d/c.     Follow Up Recommendations  No OT follow up;Supervision/Assistance - 24 hour    Equipment Recommendations  Other (comment) (tbd)    Recommendations for Other Services       Precautions / Restrictions Restrictions Weight Bearing Restrictions: No      Mobility Bed Mobility Overal bed mobility: Needs Assistance Bed Mobility: Supine to Sit;Sit to Supine     Supine to sit: Supervision Sit to supine: Supervision   General bed mobility comments: very painful. Tried to lower [facility] some to simulate home.  Pt may sleep in chair.  Transfers Overall transfer level: Needs assistance Equipment used: Rolling walker (2 wheeled) Transfers: Sit to/from Stand Sit to Stand: Min guard         General transfer comment: cues for hand placement.    Balance                                            ADL Overall ADL's : Needs assistance/impaired                 Upper Body Dressing : Minimal assistance;Sitting   Lower Body Dressing: Maximal assistance;Sit to/from stand   Toilet Transfer: Supervision/safety;Ambulation;RW (bed)   Toileting- Clothing Manipulation and Hygiene: Moderate assistance;Sitting/lateral lean;Sit to/from stand       Functional mobility during ADLs: Supervision/safety;Rolling walker General ADL Comments: Educated on dressing technique and told pt that button up shirts may be easier to manage, Educated on safety tips for home (rugs, safe shoewear). Educated on AE for LB ADLs. Educated on toilet aid for  hygiene. Discussed what pt could use for shower chair. Pt ambulated in hallway. Educated on use of bag on walker. Pt initially not wanting to get OOB to work with therapist. Spoke with significant other and recommended someone being with him 24/7 for a while.     Vision                     Perception     Praxis      Pertinent Vitals/Pain Pain 7.5/10. Nurse brought pain meds during session.     Hand Dominance     Extremity/Trunk Assessment Upper Extremity Assessment Upper Extremity Assessment: LUE deficits/detail LUE Deficits / Details: shoulder flexion less than 90 degrees due to pain in left side LUE: Unable to fully assess due to pain   Lower Extremity Assessment Lower Extremity Assessment: Overall WFL for tasks assessed       Communication Communication Communication: No difficulties   Cognition Arousal/Alertness: Awake/alert Behavior During Therapy: WFL for tasks assessed/performed Overall Cognitive Status: Within Functional Limits for tasks assessed                     General Comments       Exercises       Shoulder Instructions      Home Living Family/patient expects to be discharged to:: Private residence Living Arrangements: Alone Available Help at  Discharge: Friend(s);Available 24 hours/day (plans to stay with significant other for while) Type of Home: House Home Access: Stairs to enter Entergy CorporationEntrance Stairs-Number of Steps: 1 (pt's house) Entrance Stairs-Rails: None Home Layout: Two level Alternate Level Stairs-Number of Steps: 16 Alternate Level Stairs-Rails: Right;Left;Can reach both Bathroom Shower/Tub: Walk-in shower (pt and significant other have walk in shower)   Bathroom Toilet: Handicapped height (signficant other has higher toilet)     Home Equipment: Walker - standard;Cane - single point;Adaptive equipment Adaptive Equipment: Reacher;Long-handled shoe horn        Prior Functioning/Environment Level of Independence:  Independent             OT Diagnosis: Acute pain   OT Problem List: Decreased range of motion;Pain;Decreased knowledge of use of DME or AE;Decreased knowledge of precautions;Decreased activity tolerance   OT Treatment/Interventions: Self-care/ADL training;DME and/or AE instruction;Therapeutic activities;Patient/family education;Balance training    OT Goals(Current goals can be found in the care plan section) Acute Rehab OT Goals Patient Stated Goal: not stated OT Goal Formulation: With patient Time For Goal Achievement: 04/29/14 Potential to Achieve Goals: Good ADL Goals Pt Will Perform Lower Body Bathing: with adaptive equipment;sit to/from stand;with modified independence Pt Will Perform Lower Body Dressing: with modified independence;with adaptive equipment;sit to/from stand Pt Will Transfer to Toilet: with modified independence;ambulating;grab bars (comfort height toilet) Pt Will Perform Toileting - Clothing Manipulation and hygiene: with modified independence;sit to/from stand;with adaptive equipment Pt Will Perform Tub/Shower Transfer: Shower transfer;with modified independence;ambulating;shower seat;rolling walker  OT Frequency: Min 2X/week   Barriers to D/C:            Co-evaluation              End of Session Equipment Utilized During Treatment: Engineer, waterolling walker Nurse Communication: Mobility status;Other (comment) (O2 sats)  Activity Tolerance: Patient limited by pain Patient left: in bed;with call bell/phone within reach;with family/visitor present   Time: [id number] OT Time Calculation (min): 39 min Charges:  OT General Charges $OT Visit: 1 Procedure OT Evaluation $Initial OT Evaluation Tier I: 1 Procedure OT Treatments $Self Care/Home Management : 8-22 mins G-Codes: OT G-codes **NOT FOR INPATIENT CLASS** Functional Assessment Tool Used: clinical judgment Functional Limitation: Self care Self Care Current Status (Y7829(G8987): At least 40 percent but less  than 60 percent impaired, limited or restricted Self Care Goal Status (F6213(G8988): 0 percent impaired, limited or restricted  Earlie RavelingStraub, Wisam Siefring L OTR/L [phone] 04/22/2014, 3:40 PM

## 2014-04-23 DIAGNOSIS — D62 Acute posthemorrhagic anemia: Secondary | ICD-10-CM

## 2014-04-23 DIAGNOSIS — S22009A Unspecified fracture of unspecified thoracic vertebra, initial encounter for closed fracture: Secondary | ICD-10-CM | POA: Diagnosis not present

## 2014-04-23 MED ORDER — TRAMADOL HCL 50 MG PO TABS
50.0000 mg | ORAL_TABLET | Freq: Four times a day (QID) | ORAL | Status: DC
  Administered 2014-04-23 – 2014-04-24 (×5): 50 mg via ORAL
  Filled 2014-04-23 (×5): qty 1

## 2014-04-23 MED ORDER — NAPROXEN 500 MG PO TABS
500.0000 mg | ORAL_TABLET | Freq: Two times a day (BID) | ORAL | Status: DC
  Administered 2014-04-23 – 2014-04-24 (×3): 500 mg via ORAL
  Filled 2014-04-23 (×3): qty 1
  Filled 2014-04-23: qty 2
  Filled 2014-04-23: qty 1
  Filled 2014-04-23 (×2): qty 2
  Filled 2014-04-23: qty 1

## 2014-04-23 NOTE — Progress Notes (Signed)
Up ambulating with walker. Working on pain control. Patient examined and I agree with the assessment and plan  Violeta GelinasBurke Kearstyn Avitia, MD, MPH, FACS Trauma: [phone] General Surgery: [phone]  04/23/2014 2:52 PM

## 2014-04-23 NOTE — Progress Notes (Signed)
Patient ID: Blake [name], male   DOB: [birth year], 78 y.o.   MRN: [id number]  LOS: 3 days   Subjective: Still with significant pain.  SOB, sats adequate on RA.  CXR stable.  Tolerating diet, passing flatus, ambulating.    Objective: Vital signs in last 24 hours: Temp:  [97.7 F (36.5 C)-98.3 F (36.8 C)] 97.7 F (36.5 C) (07/27 0605) Pulse Rate:  [92-106] 94 (07/27 0605) Resp:  [16-18] 17 (07/27 0605) BP: (105-135)/(54-71) 134/71 mmHg (07/27 0605) SpO2:  [95 %-98 %] 96 % (07/27 0605) Last BM Date: 04/21/14  Lab Results:  CBC  Recent Labs  04/21/14 0347 04/22/14 0514  WBC 11.2* 8.6  HGB 12.0* 11.6*  HCT 37.4* 36.2*  PLT 127* PLATELET CLUMPS NOTED ON SMEAR, COUNT APPEARS ADEQUATE   BMET  Recent Labs  04/20/14 2128 04/21/14 04/21/14 0347  NA 142 142  --   K 4.3 4.8  --   CL 104 105  --   CO2  --  25  --   GLUCOSE 132* 145*  --   BUN 19 19  --   CREATININE 1.30 1.13 1.09  CALCIUM  --  9.2  --     Imaging: Dg Chest 2 View  04/21/2014   CLINICAL DATA:  Evaluate for possible 0 cold left pneumothorax.  EXAM: CHEST  2 VIEW  COMPARISON:  Chest x-ray 04/20/2014.  FINDINGS: Lung volumes are low. No consolidative airspace disease. Ill-defined opacity at the base of the left hemithorax presumably reflects some subsegmental atelectasis. Trace left pleural effusion. No appreciable pneumothorax. Nondisplaced fracture of the posterior aspect of the left eighth rib is again noted. Other known nondisplaced fractures are not well demonstrated on today's plain film examination (see CT examination 04/20/2014 for full details). Heart size is is within normal limits. Upper mediastinal contours are unremarkable.  IMPRESSION: 1. No appreciable pneumothorax identified. 2. Low lung volumes with nondisplaced fracture of the posterior aspect of the left eighth rib with associated passive subsegmental atelectasis in the left lower lobe and trace left pleural effusion.   Electronically Signed   By: Trudie Reedaniel   Entrikin M.D.   On: 04/21/2014 10:37     PE: General appearance: alert, cooperative and no distress Resp: clear to auscultation bilaterally Cardio: regular rate and rhythm, S1, S2 normal, no murmur, click, rub or gallop GI: soft, non-tender; bowel sounds normal; no masses,  no organomegaly Extremities: extremities normal, atraumatic, no cyanosis or edema    Patient Active Problem List   Diagnosis Date Noted  . Multiple rib fractures involving four or more ribs 04/21/2014   Assessment/Plan:  Fall from 8 feet  Posterior rib fractures left ribs 8-11 -pulmonary toilet, follow up CXR-no PTX.  Pain control.  Add tramadol, change toradol to naproxen Left transverse process fractures T8-12, L1-5 -pain control as above.  PT eval.  OT did not recommend OP follow up. FEN-tolerating diet, miralax, oral pain meds DVT prophylaxis-SCD/lovenox ABL anemia-mild stable Dispo-PT eval and pain control.  Possibly later today or tomorrow.   Ashok NorrisEmina Elyssia Strausser, ANP-BC Pager: [phone] General Trauma PA Pager: [phone]   04/23/2014 8:12 AM

## 2014-04-23 NOTE — Evaluation (Addendum)
Physical Therapy Evaluation Patient Details Name: Blake [name] MRN: [id number] DOB: Dec 19, 1935 Today's Date: 04/23/2014   History of Present Illness  78 y.o. who suffered fall from 8 feet resulting in left rib fractures, Left transverse process fractures T8-12, L1-5, and Tiny occult pneumothorax left.  Clinical Impression  Pt doing very well with all aspects of mobility, except for transitional movements of getting in/out of bed.  Pt unwilling to attempt during session secondary to increased pain, however did discuss options to decrease pain during these motions.  Pt states he will likely sleep in recliner upon D/C and as pain decreases will move to the bed.  Discussed D/C home vs pts girlfriend's house.  Feel that he would be safe at either, therefore will leave up to pt/gf.  Also went over sit<>stand as pt states this is difficult as well.  Performed at mod I level and performed stairs at S level for safety. Feel pt will not require any further follow up therapy in hospital or at home, therefore PT to D/C patient at this time.       Follow Up Recommendations No PT follow up    Equipment Recommendations  3in1 (PT)    Recommendations for Other Services       Precautions / Restrictions Precautions Precautions: None Precaution Comments: recent L rib fx and several transverse process fxs Restrictions Weight Bearing Restrictions: No      Mobility  Bed Mobility               General bed mobility comments: Pt refused to perform supine<>sit due to increased pain.  States that he plans to sleep in recliner when he gets home initially, however did provide tips for getting OOB easier and holding onto pillow to decrease trunk movement for comfort.   Transfers Overall transfer level: Modified independent Equipment used: Rolling walker (2 wheeled)             General transfer comment: better demonstration of safe hand placement today.   Ambulation/Gait Ambulation/Gait assistance:  Modified independent (Device/Increase time);Supervision Ambulation Distance (Feet): 200 Feet Assistive device: Rolling walker (2 wheeled)       General Gait Details: Pt iniitally requires S for min cues for upright posture, however progressed to mod I level (continues to have somewhat flexed posture due to increased pain).   Stairs Stairs: Yes Stairs assistance: Supervision Stair Management: One rail Right;Step to pattern;Forwards Number of Stairs: 12 General stair comments: Pt safe with stairs, recommend S for safety at this time.   Wheelchair Mobility    Modified Rankin (Stroke Patients Only)       Balance                                             Pertinent Vitals/Pain 8/10, allowed rest breaks, not yet time for pain meds again.     Home Living Family/patient expects to be discharged to:: Private residence Living Arrangements: Alone Available Help at Discharge: Friend(s);Available 24 hours/day (plans to stay with significant other for while) Type of Home: House Home Access: Stairs to enter Entrance Stairs-Rails: Right (at girlfriend's house) Entrance Stairs-Number of Steps: 1 (pt's house) Home Layout: Two level Home Equipment: Cane - single point;Adaptive equipment;Walker - 2 wheels      Prior Function Level of Independence: Independent  Hand Dominance        Extremity/Trunk Assessment               Lower Extremity Assessment: Overall WFL for tasks assessed      Cervical / Trunk Assessment: Normal;Other exceptions  Communication   Communication: No difficulties  Cognition Arousal/Alertness: Awake/alert Behavior During Therapy: WFL for tasks assessed/performed Overall Cognitive Status: Within Functional Limits for tasks assessed                      General Comments      Exercises        Assessment/Plan    PT Assessment Patent does not need any further PT services  PT Diagnosis Acute pain    PT Problem List Decreased range of motion;Decreased activity tolerance;Decreased mobility  PT Treatment Interventions     PT Goals (Current goals can be found in the Care Plan section) Acute Rehab PT Goals PT Goal Formulation: No goals set, d/c therapy    Frequency     Barriers to discharge        Co-evaluation               End of Session   Activity Tolerance: Patient tolerated treatment well Patient left: in chair;with call bell/phone within reach;with family/visitor present Nurse Communication: Mobility status    Functional Assessment Tool Used: Clinical judgement Functional Limitation: Mobility: Walking and moving around Mobility: Walking and Moving Around Current Status (Z6109): At least 1 percent but less than 20 percent impaired, limited or restricted Mobility: Walking and Moving Around Goal Status [phone]): 0 percent impaired, limited or restricted    Time: 1041-1106 PT Time Calculation (min): 25 min   Charges:   PT Evaluation $Initial PT Evaluation Tier I: 1 Procedure PT Treatments $Gait Training: 8-22 mins $Therapeutic Activity: 8-22 mins   PT G Codes:   Functional Assessment Tool Used: Clinical judgement Functional Limitation: Mobility: Walking and moving around    Blake [name] 04/23/2014, 11:15 AM

## 2014-04-23 NOTE — Progress Notes (Signed)
Utilization review completed.  

## 2014-04-24 DIAGNOSIS — S32009A Unspecified fracture of unspecified lumbar vertebra, initial encounter for closed fracture: Secondary | ICD-10-CM

## 2014-04-24 DIAGNOSIS — W19XXXA Unspecified fall, initial encounter: Secondary | ICD-10-CM

## 2014-04-24 DIAGNOSIS — I10 Essential (primary) hypertension: Secondary | ICD-10-CM | POA: Insufficient documentation

## 2014-04-24 DIAGNOSIS — S22009A Unspecified fracture of unspecified thoracic vertebra, initial encounter for closed fracture: Secondary | ICD-10-CM | POA: Diagnosis not present

## 2014-04-24 MED ORDER — OXYCODONE-ACETAMINOPHEN 5-325 MG PO TABS: 1.0000 | ORAL_TABLET | ORAL | Status: AC | PRN

## 2014-04-24 MED ORDER — NAPROXEN 500 MG PO TABS: 500.0000 mg | ORAL_TABLET | Freq: Two times a day (BID) | ORAL | Status: AC

## 2014-04-24 MED ORDER — TRAMADOL HCL 50 MG PO TABS: 50.0000 mg | ORAL_TABLET | Freq: Four times a day (QID) | ORAL | Status: AC

## 2014-04-24 NOTE — Discharge Instructions (Signed)
No driving while taking oxycodone. °

## 2014-04-24 NOTE — Discharge Summary (Signed)
Krisinda Giovanni, MD, MPH, FACS Trauma: [phone] General Surgery: [phone]  

## 2014-04-24 NOTE — Progress Notes (Signed)
[place] With [place] and BSC.  Instructions reviewed and understood for discharge.  Pain denied.

## 2014-04-24 NOTE — Discharge Summary (Signed)
Physician Discharge Summary  Patient ID: Blake [name] MRN: [id number] DOB/AGE: Jul 13, 1936 78 y.o.  Admit date: 04/20/2014 Discharge date: 04/24/2014  Discharge Diagnoses Patient Active Problem List   Diagnosis Date Noted  . Fall 04/24/2014  . Fracture of thoracic transverse process 04/24/2014  . Lumbar transverse process fracture 04/24/2014  . HTN (hypertension) 04/24/2014  . 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 He lost his balance and fell backwards about 8 feet, landing on the left side of his back. He did not hit his head. There was no loss of consciousness. His workup included CT scans of his chest, abdomen, and pelvis and showed the above-mentioned injuries. He was admitted to the trauma service for pain control and pulmonary toilet.   Hospital Course: The patient did well while in the hospital. His pain was controlled on a combination of oral medications. He was mobilized with physical and occupational therapies and did well. He was able to be discharged home in good condition.      Medication List         losartan 100 MG tablet  Commonly known as:  COZAAR  Take 100 mg by mouth daily.     naproxen 500 MG tablet  Commonly known as:  NAPROSYN  Take 1 tablet (500 mg total) by mouth 2 (two) times daily with a meal.     oxyCODONE-acetaminophen 5-325 MG per tablet  Commonly known as:  ROXICET  Take 1-2 tablets by mouth every 4 (four) hours as needed (Pain).     traMADol 50 MG tablet  Commonly known as:  ULTRAM  Take 1 tablet (50 mg total) by mouth every 6 (six) hours.             Follow-up Information   Call [facility]. (As needed)    Contact information:   [address] [place] [address] [phone]       Signed: Freeman CaldronMichael J. Halo Shevlin, PA-C Pager: [phone] General  Trauma PA Pager: [phone] 04/24/2014, 9:53 AM

## 2014-04-24 NOTE — Progress Notes (Signed)
D/c Patient examined and I agree with the assessment and plan  Violeta GelinasBurke Tashanda Fuhrer, MD, MPH, FACS Trauma: [phone] General Surgery: [phone]  04/24/2014 10:45 AM

## 2014-04-24 NOTE — Progress Notes (Signed)
  Subjective: Pain is improving from yesterday. Still experiencing SOB with exertion, sats stable on RA, repeat CXR negative. Tolerating diet, normal BM, passing flatus, ambulating.   Objective: Vital signs in last 24 hours: Temp:  [97.4 F (36.3 C)-98.4 F (36.9 C)] 97.4 F (36.3 C) (07/28 0521) Pulse Rate:  [87-97] 87 (07/28 0521) Resp:  [16-18] 17 (07/28 0521) BP: (125-156)/(60-86) 156/86 mmHg (07/28 0521) SpO2:  [88 %-98 %] 98 % (07/28 0521) Last BM Date: 04/21/14  Intake/Output from previous day: 07/27 0701 - 07/28 0700 In: 1540 [P.O.:1540] Out: 1050 [Urine:1050]    General appearance: alert, cooperative and no distress Resp: clear to auscultation bilaterally Cardio: regular rate and rhythm, S1, S2 normal, no murmur, click, rub or gallop GI: soft, non-tender; bowel sounds normal; no masses,  no organomegaly Extremities: extremities normal, atraumatic, no cyanosis or edema  Lab Results:   Recent Labs  04/22/14 0514  WBC 8.6  HGB 11.6*  HCT 36.2*  PLT PLATELET CLUMPS NOTED ON SMEAR, COUNT APPEARS ADEQUATE    Studies/Results: No results found.  Anti-infectives: Anti-infectives   None      Assessment/Plan:  Fall from 8 feet  Posterior rib fx, left ribs 8-11 - Incentive spirometry. Pain control.  Left transverse process fx, T8-T12, L1-L5 - Pain control. PT recommend 3in1, no follow up recommend.  FEN - tolerating diet, continue miralax and po pain meds DVT - SCDs in place, lovenox  ABL anemia- discuss rechecking CBC with Charma IgoMichael Jeffery, PA-C prior to D/C  Dispo - discuss dispo to home with Charma IgoMichael Jeffery, PA-C     LOS: 4 days    Blake MonasStuckey, Blake Brossard K PA-S 04/24/2014

## 2015-05-02 IMAGING — CR DG PORTABLE PELVIS
2 series · 2 of 2 positions shown · non-contrast
Comparison: None.

CLINICAL DATA: Fall from [place].  Pain.

EXAM:
PORTABLE PELVIS 1-2 VIEWS

[AP (1 of 2)]
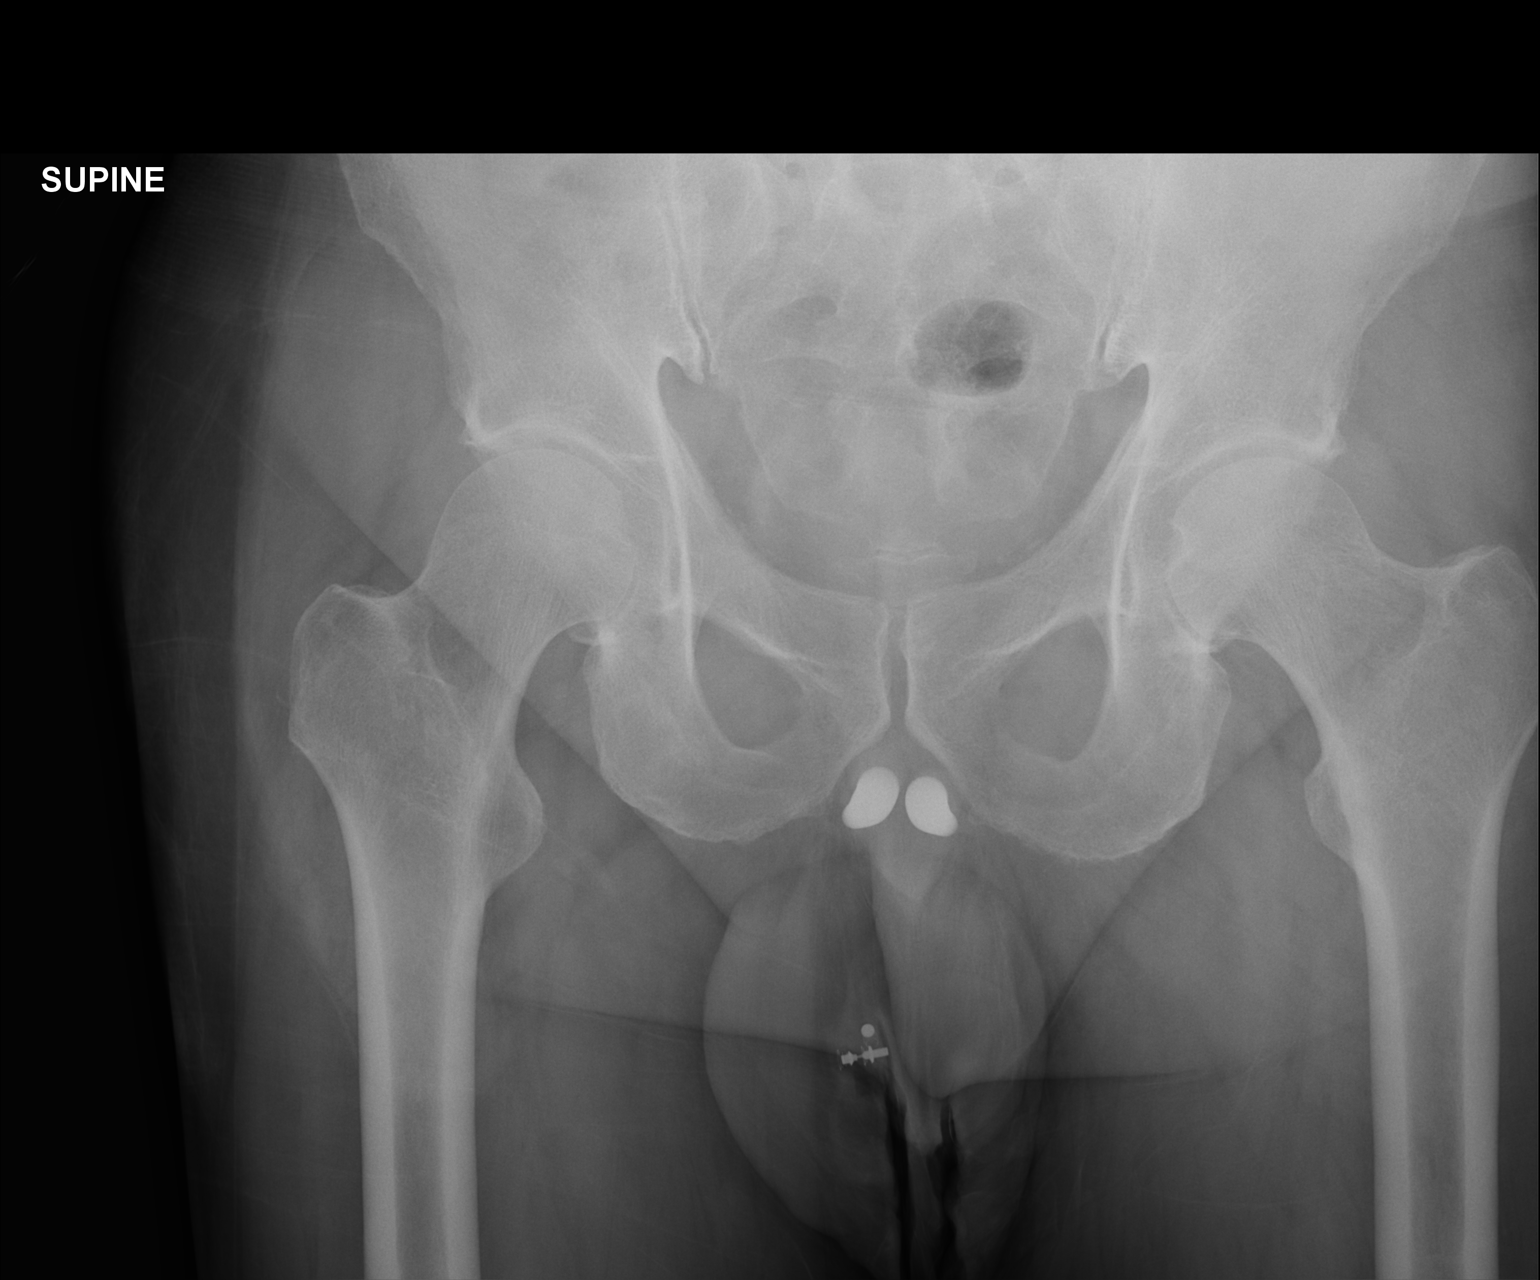

[AP (2 of 2)]
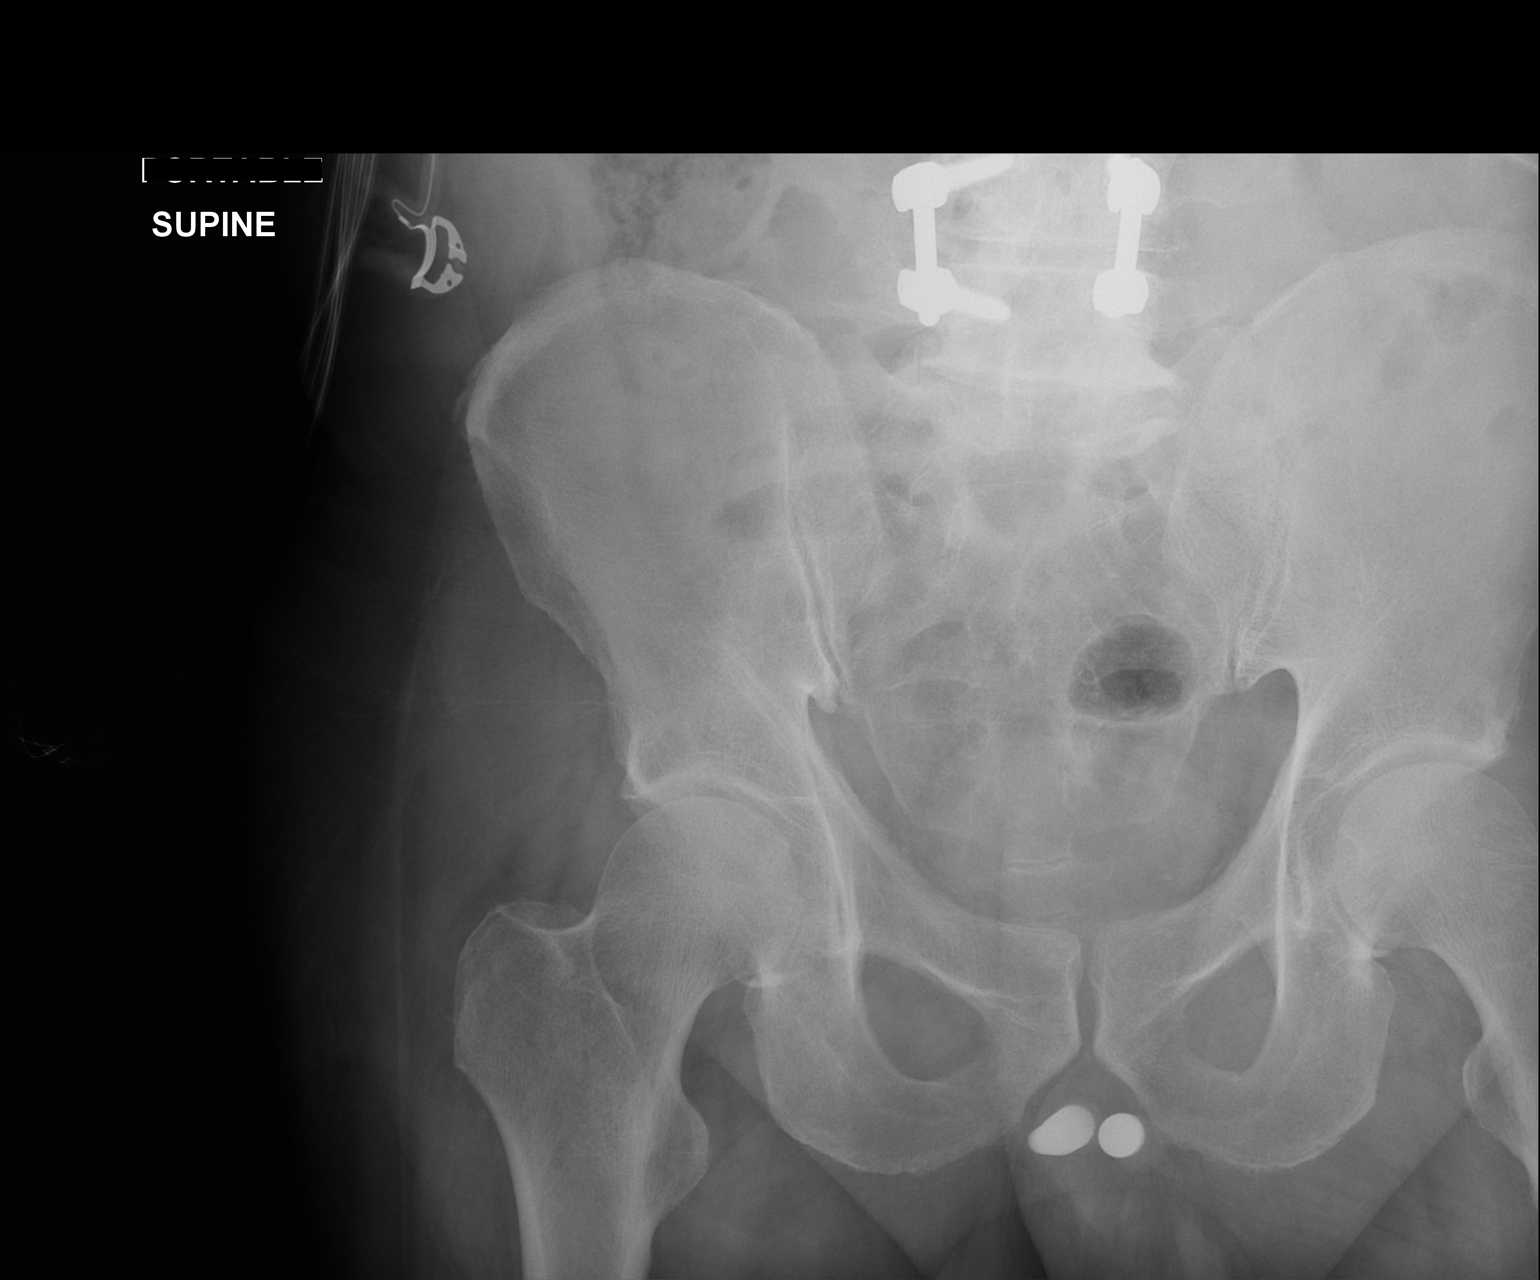

[2 of 2 positions shown; findings below may reference images not displayed]

FINDINGS: Supine view of the pelvis shows no evidence for fracture. SI joints
and symphysis pubis are unremarkable. Joint space in the hips is
well preserved and symmetric. Penile prosthesis noted. The patient
is status post lower lumbar fusion.
IMPRESSION: No evidence for acute pelvic fracture.
